# Patient Record
Sex: Male | Born: 1985 | Race: Black or African American | Hispanic: No | Marital: Single | Smoking: Never smoker
Health system: Southern US, Community
[De-identification: ages and names within clinical notes are randomized; demographics above are authoritative.]

## PROBLEM LIST (undated history)

## (undated) DIAGNOSIS — E669 Obesity, unspecified: Secondary | ICD-10-CM

## (undated) HISTORY — DX: Obesity, unspecified: E66.9

---

## 2006-05-24 ENCOUNTER — Emergency Department (HOSPITAL_COMMUNITY): Admission: EM | Admit: 2006-05-24 | Discharge: 2006-05-24 | Payer: Self-pay | Admitting: Emergency Medicine

## 2008-01-20 ENCOUNTER — Emergency Department (HOSPITAL_COMMUNITY): Admission: EM | Admit: 2008-01-20 | Discharge: 2008-01-21 | Payer: Self-pay | Admitting: Emergency Medicine

## 2008-01-28 ENCOUNTER — Emergency Department (HOSPITAL_COMMUNITY): Admission: EM | Admit: 2008-01-28 | Discharge: 2008-01-28 | Payer: Self-pay | Admitting: Emergency Medicine
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
## 2015-04-10 ENCOUNTER — Encounter (HOSPITAL_BASED_OUTPATIENT_CLINIC_OR_DEPARTMENT_OTHER): Payer: Self-pay | Admitting: *Deleted

## 2015-04-10 ENCOUNTER — Emergency Department (HOSPITAL_BASED_OUTPATIENT_CLINIC_OR_DEPARTMENT_OTHER)
Admission: EM | Admit: 2015-04-10 | Discharge: 2015-04-10 | Disposition: A | Discharge status: Home / Self Care | Payer: Managed Care, Other (non HMO) | Attending: Emergency Medicine | Admitting: Emergency Medicine

## 2015-04-10 DIAGNOSIS — J111 Influenza due to unidentified influenza virus with other respiratory manifestations: Secondary | ICD-10-CM | POA: Insufficient documentation

## 2015-04-10 DIAGNOSIS — R69 Illness, unspecified: Secondary | ICD-10-CM

## 2015-04-10 DIAGNOSIS — R05 Cough: Secondary | ICD-10-CM | POA: Diagnosis present

## 2015-04-10 NOTE — ED Notes (Signed)
Pt c/o flu like symptoms x 4 days Uri symptoms and n/v/d

## 2015-04-10 NOTE — Discharge Instructions (Signed)

## 2015-04-10 NOTE — ED Provider Notes (Signed)
CSN: [id number]     Arrival date & time 04/10/15  1753 History  This chart was scribed for Toy Cookey, MD by Chestine Spore, ED Scribe. The patient was seen in room MH05/MH05 at 7:22 PM.     Chief Complaint  Patient presents with  . URI      The history is provided by the patient. No language interpreter was used.    HPI Comments: Jimmy Long is a 29 y.o. male who presents to the Emergency Department complaining of URI onset 4 days. He states that he is having associated symptoms of n/d, HA, myalgias, fever, chills, dry cough, sneezing, mild SOB, sore throat. Pt had loose watery stools twice today without the presence of blood. He states that he has tried Alka seltzer cold with no relief for his symptoms. He denies vomiting, trouble urinating, blood in stools, and any other symptoms. Denies any sick contacts. Pt did not get the flu vaccine this year. Pt has not been on any abx recently. Pt has not been out of the country lately. Pt has not been eating and drinking normally because of lack of appetite. Pt denies heart and lung issues.   History reviewed. No pertinent past medical history. History reviewed. No pertinent past surgical history. History reviewed. No pertinent family history. History  Substance Use Topics  . Smoking status: Never Smoker   . Smokeless tobacco: Not on file  . Alcohol Use: No    Review of Systems  Constitutional: Positive for fever, chills and appetite change.  HENT: Positive for sneezing and sore throat. Negative for ear pain.   Respiratory: Positive for cough and shortness of breath (mild).   Gastrointestinal: Positive for nausea and diarrhea. Negative for vomiting and blood in stool.  Genitourinary: Negative for decreased urine volume.  Musculoskeletal: Positive for myalgias.  Neurological: Positive for headaches.   A complete 10 system review of systems was obtained and all systems are negative except as noted in the HPI and PMH.     Allergies   Review of patient's allergies indicates no known allergies.  Home Medications   Prior to Admission medications   Not on File   BP 129/79 mmHg  Pulse 84  Temp(Src) 99.8 F (37.7 C)  Resp 16  Ht  (1.727 m)  Wt 138 lb (62.596 kg)  BMI 20.99 kg/m2  SpO2 98%  Physical Exam  Constitutional: He is oriented to person, place, and time. He appears well-developed and well-nourished. No distress.  HENT:  Head: Normocephalic and atraumatic.  Right Ear: Tympanic membrane normal.  Left Ear: Tympanic membrane normal.  Mouth/Throat: Posterior oropharyngeal erythema present. No oropharyngeal exudate.  No exudate  Eyes: Pupils are equal, round, and reactive to light.  Neck: Normal range of motion. Neck supple.  Tonsillar enlargement.   Cardiovascular: Normal rate, regular rhythm and normal heart sounds.  Exam reveals no gallop and no friction rub.   No murmur heard. Pulmonary/Chest: Effort normal and breath sounds normal. No respiratory distress. He has no wheezes. He has no rales.  Abdominal: Soft. Bowel sounds are normal. He exhibits no distension and no mass. There is no tenderness. There is no rebound and no guarding.  Musculoskeletal: Normal range of motion. He exhibits no edema or tenderness.  Neurological: He is alert and oriented to person, place, and time.  Skin: Skin is warm and dry.  Psychiatric: He has a normal mood and affect.    ED Course  Procedures (including critical care time) DIAGNOSTIC STUDIES: Oxygen  Saturation is 98% on RA, nl by my interpretation.    COORDINATION OF CARE: 7:28 PM-Discussed treatment plan which includes f/u if the symptoms worsen, began taking cold and flu medications with pt at bedside and pt agreed to plan.   Labs Review Labs Reviewed - No data to display  Imaging Review No results found.   EKG Interpretation None      MDM   Final diagnoses:  Influenza-like illness    Patient presents with 4 days of influenza-like symptoms.   He is well-appearing on physical exam has benign.  Cardiopulmonary exam.  Suspect influenza and will recommend continued supportive care at home with antipyretics and by mouth hydration.  Return precautions given for new or worsening symptoms including development of shortness of breath, inability to tolerate liquids.   I personally performed the services described in this documentation, which was scribed in my presence. The recorded information has been reviewed and is accurate.     Toy CookeyMegan Luella Gardenhire, MD 04/10/15 [phone]

## 2022-08-24 ENCOUNTER — Telehealth (INDEPENDENT_AMBULATORY_CARE_PROVIDER_SITE_OTHER): Payer: Self-pay

## 2022-08-24 NOTE — Telephone Encounter (Signed)
Pt self scheduled the following appt online:  Future Appointments   Date Time Provider Department Center   09/24/2022 12:00 PM Julienne Kass, MD [facility] PCP PHR       Called pt. Left message to return call.    If pt returns call any agent can assist.    Please complete demographics/registration.  Please verify insurance.     Please assist.      Thank you.

## 2022-09-24 ENCOUNTER — Ambulatory Visit (INDEPENDENT_AMBULATORY_CARE_PROVIDER_SITE_OTHER): Payer: Federal, State, Local not specified - PPO | Admitting: Family Medicine

## 2022-09-24 ENCOUNTER — Encounter (INDEPENDENT_AMBULATORY_CARE_PROVIDER_SITE_OTHER): Payer: Self-pay | Admitting: Family Medicine

## 2022-09-24 VITALS — BP 123/67 | HR 57 | Temp 97.3°F | Resp 18 | Ht 75.0 in | Wt 275.0 lb

## 2022-09-24 DIAGNOSIS — R1031 Right lower quadrant pain: Secondary | ICD-10-CM

## 2022-09-24 DIAGNOSIS — Z8719 Personal history of other diseases of the digestive system: Secondary | ICD-10-CM

## 2022-09-24 DIAGNOSIS — Z1339 Encounter for screening examination for other mental health and behavioral disorders: Secondary | ICD-10-CM

## 2022-09-24 DIAGNOSIS — Z9889 Other specified postprocedural states: Secondary | ICD-10-CM

## 2022-09-24 NOTE — Progress Notes (Signed)
Family Medicine        Chief Complaint   Patient presents with    Establish Care    New Patient         Subjective:         Seth Vargas is a 36 year old male here for establish care.    GI -  Hernia surgery right inguinal region while in [place] 2018. For the past 6 months, noticing burning pulling sensation right lower abdominal / inguinal area. No relationship to position or meals.  Now works as a Administrator.     Shx  Smoked for 23 years. Quit 6 years ago. Interested in screening for conditions/[place] in his body.       OBJECTIVE  Vitals:    09/24/22 1142   BP: 123/67   BP Location: Left arm   BP Patient Position: Sitting   BP cuff size: Large   Pulse: 57   Resp: 18   Temp: 97.3 F (36.3 C)   TempSrc: Temporal   SpO2: 98%   Weight: 124.7 kg (275 lb)   Height: 6\' 3"  (1.905 m)       Physical exam:      Gen - WDWN, NAD  GI - S/ND,no masses ; RLQ discomfort on deep palpation.       Labs and Imaging:     Ordered         ASSESSMENT/PLAN        ICD-10-CM ICD-9-CM   1. Right lower quadrant pain  R10.31 789.03   2. History of hernia surgery  Z98.890 V45.89    Z87.19    3. Screened negative for significant alcohol use  Z13.39 V79.1     Given hx of surgery, discussed [place] abd. RLQ pain/burning x 6 months. No red flags on hx.   Pt opted to do Physical on a later visit.       Marlena Clipper, MD

## 2022-09-27 NOTE — Progress Notes (Unsigned)
Family Medicine     ***  ask if fasting  *** measure abdominal circumference       No chief complaint on file.        Subjective:         Seth Vargas is a 36 year old male here for establish care and physical ***    Obesity Class 1:  age at onset of weight gain,   events associated with weight gain,   previous weight loss attempts,   change in dietary patterns,   history of exercise,   current and past medications  history of smoking cessation.     Medications are a common cause of weight gain, in particular insulin, sulfonylureas, thiazolidinediones, glucocorticoids, and antipsychotics (table 3). Smoking cessation is also associated with weight gain. (See "Benefits and consequences of smoking cessation", section on 'Weight gain'.)    Exercise: ***  Diet: ***  Mood: ***  Work: ***  Alcohol: ***  Tobacco: ***  Drugs: ***    -Sexual history:   -How many sexual partners in past year: ***   -Type of sexual activity: []  oral, []  anal, []  vaginal   -History of STIs: ***   -Use of barrier contraception:***  -Most recent HIV screen: ***  -Most recent GC screen: ***   -Genital:***   -Oral:***   -Anal: ***  -Most recent Syphilis screen: ***  -Hx of PrEP therapy: ***  -Currently on PrEP: ***  -Hep B vaccination: ***  -Hep A vaccination: ***  -HCV AB: ***      Cardiovascular Disease:  Per 2021 USPSTF Blood Pressure Screening Guidelines and JNC-8  Blood pressure screen performed today:      [_] Pharmacologic treatment indicated given BP >150/90 in patient aged >37     [_] Pharmacologic treatment indicated given BP >140/90 in patient aged <60     [_] Pharmacologic treatment indicated given BP >140/90 in patient with diabetes     [***] Next screen due in 1 year as BP <140/90    Dyslipidemia:   Per 2013 USPSTF Lipid Disorders Screening Guidelines (Grade A)   I discussed the risks and benefits of lipid screening with the patient.   [***] Patient is 68 or older and has elected to undergo lipid screen   [_] Screening  recommended, patient declines screening   [_] No risk factors present, screening not indicated      Weight Control:   Per 2018 USPSTF Obesity Screening Guidelines (Grade B) and ACC/AHA   BMI screening performed today      []  Patient BMI < 18.5, see underweight plan below     []  Patient BMI WNL, discussed ACC/AHA diet and exercise recommendations to maintain weight     [x]  Patient BMI 25 or greater, see overweight plan below     []  Patient's BMI >25 and CVD risk factors (HTN, HLD, DM, personal or family history) or >30, see obesity plan below    Diabetes Mellitus:   Per 2021 USPSTF Type 2 Diabetes Mellitus Screening Guidelines (Grade B)  Indication: Asymptomatic adult age >83 with BMI >25  Based on the above risk factors:     [***] Screening indicated, patient elects to be screened for diabetes with a hemoglobin A1C      [_] Patient with evidence of pre-diabetes and will be screened yearly     [_] Patient without evidence of pre-diabetes and will be screened every 3 years     [_] Screening indicated, patient declines screening  Vaccines:   Per CDC Guidelines   ***Seasonal influenza: Recommended for all patients  ***Td/Tdap (1 dose every 10 years, can be administered regardless of interval)   ***Hepatitis A: Indication: Patient with risk factors (MSM,  IVDU, chronic liver disease or receive clotting factor  concentrates, working with HAV-infected primates or with HAV in a  research laboratory setting, travel to high or  intermediate endemicity of hepatitis A, close personal contact (eg,  household or regular babysitting) with an international adoptee during  the first 60 days after arrival in the [place] from a country with  high or intermediate endemicity)  ***Hepatitis B: Indication: Patient with risk factors (Sexually  active with >1 partner during the previous 6 months, seeking  evaluation or treatment for a STD, IVDU, MSM, Administrator workers, DM, end-stage renal disease, HIV,  and/or chronic  liver disease)     [***] Patient elects for the following vaccines: ***    [***] Patient declines the following vaccines: ***     STD:   Per USPSTF Screening Guidelines  ***Indication: Patient without risk factors, no further testing indicated  ***Indication: Patient with risk factors (MSM, IVDU, multiple partners, or seeking STD screening/treatment)  ***Indication: Patient is a sexually active male age <25.    [_] Patient elects to be screened for:          [_] GC/Chlamydia, HIV, and Hepatitis B, for risk factors listed above          [_] Syphilis, for risk factors (commercial sex worker and/or person in correctional facilities)     [_] Patient declines screening     HIV:   Per 2019 USPSTF HIV Infection Screening Guidelines (Grade A)  Indication: All low risk adults should receive 1 HIV screen during their lifetime. For patient with high risk sexual activity see above STD screening recommendations.    [***] Patient elects to be screened today    [***] Patient declines screening     Hepatitis C:   Per 2020 USPSTF Hepatitis C Screening Guidelines (Grade B)   Indication: All low risk adults should receive 1 Hepatitis C screen during their lifetime. For patient with high risk sexual activity see above STD screening recommendations.    [***] Patient elects to be screened today    [***] Patient declines screening     Psycho-Social:      [***] Patient without evidence of or risk factors for depression, anxiety, alcohol, tobacco, or domestic abuse      [***] Patient screened positive, see below for plan        Review of systems:  ***  Review of Systems - Constitutional: negative for: fatigue, weight loss, fever.  Eyes: negative for:  blurry vision, eye pain.  Ears, Nose, Mouth, Throat: negative for:  sore throat, nasal congestion, rhinorrhea, ear pain.  CV: negative for:  palpitations, chest pain, lower extremity edema.  Resp: negative for:  cough, shortness of breath, wheezing.  GI: negative for:  vomiting, nausea, abdominal pain, diarrhea.  GU: negative for: dysuria, hematuria.  Musculoskeletal: negative for: joint swelling, joint pain, muscle weakness.  Integumentary: negative for: rash, itching.  Neuro: negative for: headaches, seizures, numbness or tingling.  Psych: negative for: depressed mood, anxiety, and suicidal ideation.      OBJECTIVE  There were no vitals filed for this visit.    Physical exam:    ***  Gen - WDWN, NAD  Eyes - EOMI, PERRLA, Conjunctiva clear  ENT - External ears  appear normal, TMs normal bilaterally, MMM, oropharynx normal  Neck - Supple, no thyromegaly  Resp - CTA bilaterally, normal resp effort  Card - RRR, normal S1 and S2, no m/r/g  GI - S/NT/ND,no masses  GU - Male - chaperone offered and pt declined. descended testes bilaterally, no inguinal hernia, circumcised phallus, no penile discharge ***  MSK -   Shoulder - Normal ROM of shoulder bilaterally on abduction, forward flexion., no effusion. Knee ROM - flexion, extension WNL, neg Varus and Valgus bilaterally, no effusion.   Hip - external and internal rotation WNL, Hip Flexion WNL bilaterally.   Wrist and elbows - Normal ROM on flexion and extension, no effusion.  Neuro - CN 2-12 intact,  No focal deficit present. Mental Status: He is alert.   Psychiatric -   Mood and Affect: normal. Thought content normal. Judgment normal.      Smoking Cessation Counseling - ***   Discussed with pt that it is very important that pt quits smoking. There are various alternatives available to help with this difficult task, but first and foremost, pt must make a firm commitment and decide to quit. The nature of nicotine addiction is discussed. The usefulness of behavioral therapy is discussed and suggested.  The correct use, cost and side effects of nicotine replacement therapy, such as gum or patches, are discussed. Zyban and its cost and side effects are reviewed. The quit rates are discussed. I recommend pt  not allow costs of treatment to  deter him from using NRT, chantix or Wellbutrin, as the long-term economic and health benefits are obvious.    Time spent 3-10 minutes/>10 minutes***.      Labs and Imaging:     Lipids:  No results found for: CHOL, HDL, LDLCALC, TRIG, LDLDIRECT    Diabetes screen:No results found for: A1C  Thyroid:  No results found for: TSH        ASSESSMENT/PLAN      Preventative labs discussed.   Advised healthy lifestyle, diet and exercise.   Encouraged annual physicals.   ***Immunization today - reviewed vaccine components, counseled regarding risks/benefits, signs/symptoms of adverse effects and when to seek medical attention for any adverse effects.    ***    Forrest Moron, PA-C  [facility]

## 2022-09-28 ENCOUNTER — Encounter (INDEPENDENT_AMBULATORY_CARE_PROVIDER_SITE_OTHER): Payer: Self-pay | Admitting: Physician Assistant

## 2022-09-28 ENCOUNTER — Other Ambulatory Visit: Payer: Federal, State, Local not specified - PPO | Attending: Physician Assistant | Admitting: Physician Assistant

## 2022-09-28 VITALS — BP 129/65 | HR 63 | Temp 97.3°F | Ht 75.0 in | Wt 266.8 lb

## 2022-09-28 DIAGNOSIS — Z1159 Encounter for screening for other viral diseases: Secondary | ICD-10-CM | POA: Insufficient documentation

## 2022-09-28 DIAGNOSIS — Z13220 Encounter for screening for lipoid disorders: Secondary | ICD-10-CM | POA: Insufficient documentation

## 2022-09-28 DIAGNOSIS — Z114 Encounter for screening for human immunodeficiency virus [HIV]: Secondary | ICD-10-CM | POA: Insufficient documentation

## 2022-09-28 DIAGNOSIS — Z0000 Encounter for general adult medical examination without abnormal findings: Secondary | ICD-10-CM | POA: Insufficient documentation

## 2022-09-28 DIAGNOSIS — Z6834 Body mass index (BMI) 34.0-34.9, adult: Secondary | ICD-10-CM | POA: Insufficient documentation

## 2022-09-28 DIAGNOSIS — Z23 Encounter for immunization: Secondary | ICD-10-CM

## 2022-09-28 DIAGNOSIS — L2082 Flexural eczema: Secondary | ICD-10-CM

## 2022-09-28 DIAGNOSIS — Z87891 Personal history of nicotine dependence: Secondary | ICD-10-CM

## 2022-09-28 DIAGNOSIS — E669 Obesity, unspecified: Secondary | ICD-10-CM | POA: Insufficient documentation

## 2022-09-28 LAB — COMPREHENSIVE METABOLIC PANEL, BLOOD
ALT (SGPT): 30 U/L (ref 0–41)
AST (SGOT): 23 U/L (ref 0–40)
Albumin: 4.4 g/dL (ref 3.5–5.2)
Alkaline Phos: 57 U/L (ref 40–129)
Anion Gap: 9 mmol/L (ref 7–15)
BUN: 8 mg/dL (ref 6–20)
Bicarbonate: 27 mmol/L (ref 22–29)
Bilirubin, Tot: 0.52 mg/dL (ref <1.2)
Calcium: 9.4 mg/dL (ref 8.5–10.6)
Chloride: 103 mmol/L (ref 98–107)
Creatinine: 1.02 mg/dL (ref 0.67–1.17)
Glucose: 85 mg/dL (ref 70–99)
Potassium: 4.3 mmol/L (ref 3.5–5.1)
Sodium: 139 mmol/L (ref 136–145)
Total Protein: 7.5 g/dL (ref 6.0–8.0)
eGFR Based on CKD-EPI 2021 Equation: >60 mL/min/{1.73_m2}

## 2022-09-28 LAB — TSH, BLOOD: TSH: 1.25 u[IU]/mL (ref 0.27–4.20)

## 2022-09-28 LAB — HIV 1/2 ANTIBODY & P24 ANTIGEN ASSAY, BLOOD: HIV 1/2 Antibody & P24 Antigen Assay: NONREACTIVE

## 2022-09-28 LAB — GLYCOSYLATED HGB(A1C), BLOOD: Glyco Hgb (A1C): 5.1 % (ref 4.8–5.8)

## 2022-09-28 LAB — LIPID(CHOL FRACT) PANEL, BLOOD
Cholesterol: 178 mg/dL (ref <200)
HDL-Cholesterol: 50 mg/dL
LDL-Chol (Calc): 119 mg/dL (ref <160)
Non-HDL Cholesterol: 128 mg/dL
Triglycerides: 43 mg/dL (ref 10–170)

## 2022-09-28 LAB — CBC WITH DIFF, BLOOD
ANC-Automated: 2.6 10*3/uL (ref 1.6–7.0)
Abs Basophils: 0 10*3/uL (ref <0.2)
Abs Eosinophils: 0.2 10*3/uL (ref 0.0–0.5)
Abs Lymphs: 1.4 10*3/uL (ref 0.8–3.1)
Abs Monos: 0.4 10*3/uL (ref 0.2–0.8)
Basophils: 1 %
Eosinophils: 5 %
Hct: 47.7 % (ref 40.0–50.0)
Hgb: 15.4 gm/dL (ref 13.7–17.5)
Lymphocytes: 31 %
MCH: 26.8 pg (ref 26.0–32.0)
MCHC: 32.3 g/dL (ref 32.0–36.0)
MCV: 83.1 um3 (ref 79.0–95.0)
MPV: 11.2 fL (ref 9.4–12.4)
Monocytes: 8 %
Plt Count: 161 10*3/uL (ref 140–370)
RBC: 5.74 10*6/uL (ref 4.60–6.10)
RDW: 14.4 % — ABNORMAL HIGH (ref 12.0–14.0)
Segs: 56 %
WBC: 4.6 10*3/uL (ref 4.0–10.0)

## 2022-09-28 LAB — HEPATITIS C AB, BLOOD: Hepatitis C Ab: NONREACTIVE

## 2022-09-28 MED ORDER — TRIAMCINOLONE ACETONIDE 0.1 % EX OINT
1.0000 | TOPICAL_OINTMENT | Freq: Two times a day (BID) | CUTANEOUS | 0 refills | Status: AC
Start: 2022-09-28 — End: 2022-10-12

## 2022-09-28 NOTE — Interdisciplinary (Signed)
2 Patient Identifiers verified Seth Vargas and 1986/12/17, Allergies, Dose, Vaccine/Medication & MD order verified prior to admin. Pt aware to wait 15 min after injection. Pt was closely monitored for any complications before leaving office. Appropriate discharge documentation provided.  Pt tolerated well.

## 2022-09-28 NOTE — Interdisciplinary (Signed)
Blood drawn from left arm with 21 gauge needle. 5 tubes taken.   Patient identity authenticated by Cullen Vanallen.

## 2022-10-02 ENCOUNTER — Encounter (INDEPENDENT_AMBULATORY_CARE_PROVIDER_SITE_OTHER): Payer: Self-pay | Admitting: Hospital

## 2022-10-14 ENCOUNTER — Encounter (INDEPENDENT_AMBULATORY_CARE_PROVIDER_SITE_OTHER): Payer: Federal, State, Local not specified - PPO | Admitting: Family Medicine

## 2022-10-14 DIAGNOSIS — Z131 Encounter for screening for diabetes mellitus: Secondary | ICD-10-CM

## 2022-10-14 DIAGNOSIS — Z13220 Encounter for screening for lipoid disorders: Secondary | ICD-10-CM

## 2022-10-14 DIAGNOSIS — Z0000 Encounter for general adult medical examination without abnormal findings: Secondary | ICD-10-CM

## 2022-10-19 ENCOUNTER — Telehealth (HOSPITAL_BASED_OUTPATIENT_CLINIC_OR_DEPARTMENT_OTHER): Payer: Self-pay

## 2022-10-19 NOTE — Telephone Encounter (Signed)
Called and left voicemail for patient to call [phone] to schedule Nutrition consult.

## 2022-10-21 NOTE — Telephone Encounter (Signed)
Called and left voicemail for patient to call [phone] to schedule Nutrition consult.

## 2022-11-03 ENCOUNTER — Other Ambulatory Visit (INDEPENDENT_AMBULATORY_CARE_PROVIDER_SITE_OTHER): Payer: Federal, State, Local not specified - PPO

## 2022-12-31 ENCOUNTER — Other Ambulatory Visit (INDEPENDENT_AMBULATORY_CARE_PROVIDER_SITE_OTHER): Payer: Federal, State, Local not specified - PPO

## 2023-01-13 ENCOUNTER — Encounter (INDEPENDENT_AMBULATORY_CARE_PROVIDER_SITE_OTHER): Payer: Self-pay | Admitting: Physician Assistant

## 2023-01-13 NOTE — Telephone Encounter (Signed)
From: Gretel Acre  To: Forrest Moron, PA  Sent: 01/13/2023 10:39 AM PST  Subject: Schedule    Hello,   I would like to schedule office visit.    Are your taking new patients this year?  Not seeing where I can book a time     Thanks   Seth Vargas  [phone]

## 2023-01-13 NOTE — Telephone Encounter (Signed)
Sent Patient MyChart Message

## 2023-01-13 NOTE — Telephone Encounter (Signed)
Appointment scheduled 01/19/23 with Seth Vargas

## 2023-01-19 ENCOUNTER — Encounter (INDEPENDENT_AMBULATORY_CARE_PROVIDER_SITE_OTHER): Payer: Federal, State, Local not specified - PPO | Admitting: Physician Assistant

## 2023-01-20 ENCOUNTER — Encounter (INDEPENDENT_AMBULATORY_CARE_PROVIDER_SITE_OTHER): Payer: Self-pay | Admitting: Hospital

## 2023-01-27 ENCOUNTER — Encounter (INDEPENDENT_AMBULATORY_CARE_PROVIDER_SITE_OTHER): Payer: Self-pay | Admitting: Hospital

## 2023-01-27 ENCOUNTER — Telehealth (INDEPENDENT_AMBULATORY_CARE_PROVIDER_SITE_OTHER): Payer: Federal, State, Local not specified - PPO | Admitting: Pulmonary Disease

## 2023-01-27 ENCOUNTER — Encounter (INDEPENDENT_AMBULATORY_CARE_PROVIDER_SITE_OTHER): Payer: Self-pay | Admitting: Pulmonary Disease

## 2023-01-27 VITALS — Ht 75.0 in | Wt 266.8 lb

## 2023-01-27 DIAGNOSIS — E669 Obesity, unspecified: Secondary | ICD-10-CM

## 2023-01-27 DIAGNOSIS — Z6834 Body mass index (BMI) 34.0-34.9, adult: Secondary | ICD-10-CM

## 2023-01-27 DIAGNOSIS — G4733 Obstructive sleep apnea (adult) (pediatric): Secondary | ICD-10-CM

## 2023-01-27 DIAGNOSIS — R5383 Other fatigue: Secondary | ICD-10-CM

## 2023-01-27 NOTE — Patient Instructions (Signed)
Assessment:  Patient has snoring arousals and daytime fatigue  DiScussed the nature of sleep apnea and reviewed the options for testing and treatment  Advised a home sleep study should be done which was explained  Will see patient after testing and will followup after testing  Patient understood and agreed.    Plan:  The patient should be cautioned against driving while feeling sleepy.  Conservative measures include the goal of seven to eight hours of sleep per night, the maintenance of nasal patency, the avoidance of depressants including alcohol and weight loss as indicated (through diet and exercise).  Adherence to therapy was discussed at some length.  General measures of sleep hygiene include the avoidance of naps, alcohol, tobacco and caffeine.  Maintaining a fixed bed time and wake up time while avoiding prolonged periods in bed while awake can be helpful.  We recommend moving the alarm clock to a position that is not visible by the patient.  We also recommend avoiding exercise close to bed time but timing exercise instead earlier in the day.  We have recommended follow-up in clinic and the patient will call sooner if necessary.

## 2023-01-27 NOTE — Progress Notes (Signed)
[place]  [address],  [place] [address]      History of Present Illness:  Seth Vargas is a 37 year old male is here for a sleep evaluation.  Patient has been married for 2 years and wife has noted snoring.  Patient aware of this for many years.  Normally goes to bed between 9-10 pm and does not take sleeping pills or alcohol.  Falls asleep in 15-20 min and awakens 2-3 x a night and usually has nocturia and gets back to sleep. Has had sweats at night and rare palpitations. Gets up between 6-7 am without an alarm clock and partially rested and takes a nap during the day. 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     Denies nocturnal gasping choking or breathless, sleep walking,leg jerks, restless legs,  talking or dream enactment.        The Epworth Sleepiness Score is Epworth Total Score: 8 with a maximum of 24.      ---------------------(data below generated by Lorelee Cover, DO)--------------------    Patient Verification & Telemedicine Consent:    I am proceeding with this evaluation at the direct request of the patient.  I have verified this is the correct patient and have obtained verbal consent and written consent from the patient/ surrogate to perform this voluntary telemedicine evaluation (including obtaining history, performing examination and reviewing data provided by the patient).   The patient/ surrogate has the right to refuse this evaluation.  I have explained risks (including potential loss of confidentiality), benefits, alternatives, and the potential need for subsequent face to face care. Patient/ surrogate understands that there is a risk of medical inaccuracies given that our recommendations will be made based on reported data (and we must therefore assume this information is accurate).  Knowing that there is a risk that this information is not reported accurately, and that the telemedicine video, audio, or data feed may be  incomplete, the patient agrees to proceed with evaluation and holds [place] harmless knowing these risks. In this evaluation, we will be providing recommendations only. The patient/ surrogate has been notified that other healthcare professionals (including students, residents and Engineer, maintenance) may be involved in this audio-video evaluation.   All laws concerning confidentiality and patient access to medical records and copies of medical records apply to telemedicine.  The patient/ surrogate has received the [place] Notice of Privacy Practices.  I have reviewed this above verification and consent paragraph with the patient/ surrogate.  If the patient is not capacitated to understand the above, and no surrogate is available, since this is not an emergency evaluation, the visit will be rescheduled until such time that the patient can consent, or the surrogate is available to consent.    Demographics:   Medical Record #: [id number]   Date: January 27, 2023   Patient Name: Seth Vargas   DOB: September 03, 1986  Age: 37 year old  Sex: male  Location: Home address on file    Evaluator(s):   Kay Ricciuti was evaluated by me today.    Clinic Location: [facility] [address] [facility]  [address]  [place] [address]          Review of Systems:    (Positives in bold)    Constitutional: fever, chills, hot flashes/night sweats in the last year, fatigue, weight change  HEENT:Has corrective lense. No vision changes, dizziness, HA, dysphagia, hearing loss, rhinorrhea, sore throat  CV: NO CP, palpitations, orthopnea, DOE, PND, peripheral edema  Respiratory: No SOB, cough, wheezing, hemoptysis  GI: No Anorexia, nausea, vomiting, diarrhea, constipation, abdominal pain, bloody stool  GU: No dysuria, hesitancy, urgency, hematuria  MSK: NO muscle weakness, joint pain, decreased range of motion  Neuro:Low back pain . NO   dizziness, weakness, numbness, tingling, syncope,  bladder/bowel incontinence   Integumentary: No rash  Psych: No depression, anxiety    Past Medical History:   Diagnosis Date    Obesity        There is no problem list on file for this patient.      Past Surgical History:   Procedure Laterality Date    HERNIA REPAIR Right 2018    inguinal hernia       Family History   Problem Relation Name Age of Onset    Cancer Mother          cervical [place]    No Known Problems Father      No Known Problems Sister      No Known Problems Brother      Cancer M Grandmother          throat [place] - smoker    No Known Problems P Grandmother      No Known Problems P Grandfather         Social History     Socioeconomic History    Marital status: Single   Tobacco Use    Smoking status: Former     Packs/day: 0.50     Years: 23.00     Additional pack years: 0.00     Total pack years: 11.50     Types: Cigarettes     Quit date: 2017     Years since quitting: 7.0    Smokeless tobacco: Never   Vaping Use    Vaping Use: Never used   Substance and Sexual Activity    Alcohol use: Yes     Alcohol/week: 1.0 standard drink of alcohol     Types: 1 Cans of beer per week    Drug use: Never    Sexual activity: Yes     Partners: Female   Other Topics Concern    Military Service No    Blood Transfusions No    Caffeine Concern No    Occupational Exposure No    Hobby Hazards No    Sleep Concern No    Stress Concern Yes     Comment: stress in owning trucking business    Weight Concern Yes    Special Diet No    Back Care Yes    Exercises Regularly Yes     Comment: twice weekly. treadmill and weights for 20 min each    Seat Belt Use Yes    Performs Self-Exams Yes         No Known Allergies    No current outpatient medications on file prior to visit.     No current facility-administered medications on file prior to visit.           I have reviewed the following  laboratory data and other diagnostic studies:   CBC:  Lab Results   Component Value Date    WBC 4.6 09/28/2022    HGB 15.4 09/28/2022    HCT 47.7 09/28/2022     PLT 161 09/28/2022     CHEM:  Lab Results  Component Value Date    NA 139 09/28/2022    K 4.3 09/28/2022    CL 103 09/28/2022    BICARB 27 09/28/2022    BUN 8 09/28/2022    CREAT 1.02 09/28/2022    GLU 85 09/28/2022    [place] 9.4 09/28/2022     COAG:  No results found for: "PT", "PTT", "INR"  LFTs:  Lab Results   Component Value Date    AST 23 09/28/2022    ALT 30 09/28/2022    ALK 57 09/28/2022    TP 7.5 09/28/2022    ALB 4.4 09/28/2022    TBILI 0.52 09/28/2022            I have nothing else to add to the documentation regarding the patient's allergies, medications, family history, social history, review of systems or physical examination.     Assessment:  Patient has snoring arousals and daytime fatigue  DiScussed the nature of sleep apnea and reviewed the options for testing and treatment  Advised a home sleep study should be done which was explained  Will see patient after testing and will followup after testing  Patient understood and agreed.    Plan:  The patient should be cautioned against driving while feeling sleepy.  Conservative measures include the goal of seven to eight hours of sleep per night, the maintenance of nasal patency, the avoidance of depressants including alcohol and weight loss as indicated (through diet and exercise).  Adherence to therapy was discussed at some length.  General measures of sleep hygiene include the avoidance of naps, alcohol, tobacco and caffeine.  Maintaining a fixed bed time and wake up time while avoiding prolonged periods in bed while awake can be helpful.  We recommend moving the alarm clock to a position that is not visible by the patient.  We also recommend avoiding exercise close to bed time but timing exercise instead earlier in the day.  We have recommended follow-up in clinic and the patient will call sooner if necessary.

## 2023-01-29 ENCOUNTER — Encounter (INDEPENDENT_AMBULATORY_CARE_PROVIDER_SITE_OTHER): Payer: Self-pay | Admitting: Pulmonary Disease

## 2023-01-29 DIAGNOSIS — G4733 Obstructive sleep apnea (adult) (pediatric): Secondary | ICD-10-CM

## 2023-02-03 ENCOUNTER — Encounter (INDEPENDENT_AMBULATORY_CARE_PROVIDER_SITE_OTHER): Payer: Federal, State, Local not specified - PPO

## 2023-02-16 ENCOUNTER — Encounter (INDEPENDENT_AMBULATORY_CARE_PROVIDER_SITE_OTHER): Payer: Federal, State, Local not specified - PPO

## 2023-02-17 ENCOUNTER — Ambulatory Visit (INDEPENDENT_AMBULATORY_CARE_PROVIDER_SITE_OTHER): Payer: Federal, State, Local not specified - PPO | Admitting: Sleep Studies

## 2023-02-17 DIAGNOSIS — G4733 Obstructive sleep apnea (adult) (pediatric): Secondary | ICD-10-CM

## 2023-02-17 DIAGNOSIS — R5383 Other fatigue: Secondary | ICD-10-CM

## 2023-02-17 DIAGNOSIS — E669 Obesity, unspecified: Secondary | ICD-10-CM

## 2023-02-17 NOTE — Procedures (Signed)
[facility]  [address]  Phone [phone]      MEDICAL EQUIPMENT DISCLOSURE      Seth Vargas Date of Birth: [birth year]  MRN [id number]      The equipment patient is taking is a [name] Home Sleep Study 02/17/23:  He/She was referred for a home sleep test (HST) for symptoms and concerns associated with OSA. Education of the [employer] equipment was completed by Marcelene Butte, RPSGT.    Visit Summary: Instruction was given on the use and proper placement of each component of the HST.  After reviewing the procedure with the sleep tech, the patient verbally demonstrated how to properly place the HST device and all necessary components (respiratory belt, ECG, airflow/nasal pressure, and SpO2).  The patient was given an on call number of [phone] to call with any questions they may have.     This equipment is for diagnostic purposes only.    Further, your signature below indicates that patient has been instructed on operating the device.    Instructor's Name: Marcelene Butte, RPSGT      Date: 02/17/23    Electronically signed by, Marcelene Butte , RPSGT

## 2023-03-02 ENCOUNTER — Encounter (INDEPENDENT_AMBULATORY_CARE_PROVIDER_SITE_OTHER): Payer: Federal, State, Local not specified - PPO

## 2023-03-07 DIAGNOSIS — G4730 Sleep apnea, unspecified: Secondary | ICD-10-CM

## 2023-03-21 ENCOUNTER — Encounter (INDEPENDENT_AMBULATORY_CARE_PROVIDER_SITE_OTHER): Payer: Self-pay | Admitting: Hospital

## 2023-03-24 ENCOUNTER — Telehealth (INDEPENDENT_AMBULATORY_CARE_PROVIDER_SITE_OTHER): Payer: Commercial Managed Care - PPO | Admitting: Pulmonary Disease

## 2023-03-24 ENCOUNTER — Encounter (INDEPENDENT_AMBULATORY_CARE_PROVIDER_SITE_OTHER): Payer: Self-pay | Admitting: Pulmonary Disease

## 2023-03-24 ENCOUNTER — Encounter (INDEPENDENT_AMBULATORY_CARE_PROVIDER_SITE_OTHER): Payer: Self-pay | Admitting: Hospital

## 2023-03-24 ENCOUNTER — Telehealth (INDEPENDENT_AMBULATORY_CARE_PROVIDER_SITE_OTHER): Payer: Self-pay | Admitting: Pulmonary Disease

## 2023-03-24 ENCOUNTER — Encounter (INDEPENDENT_AMBULATORY_CARE_PROVIDER_SITE_OTHER): Payer: Commercial Managed Care - PPO | Admitting: Pulmonary Disease

## 2023-03-24 VITALS — Ht 75.0 in | Wt 266.8 lb

## 2023-03-24 DIAGNOSIS — G4722 Circadian rhythm sleep disorder, advanced sleep phase type: Secondary | ICD-10-CM

## 2023-03-24 DIAGNOSIS — G4733 Obstructive sleep apnea (adult) (pediatric): Secondary | ICD-10-CM

## 2023-03-24 DIAGNOSIS — R5383 Other fatigue: Secondary | ICD-10-CM

## 2023-03-24 NOTE — Telephone Encounter (Signed)
Called patient left VM to login to video appt

## 2023-03-24 NOTE — Progress Notes (Signed)
This patient was referred by Cecille Amsterdam, DO  [address]  [address]  [place],  [place] [address]      History of Present Illness:  Seth Vargas is a 37 year old male is here for followup after a home sleep study which was done on 02/17/2023.  The REI was 3.7.  Discussed with patient who notes snoring arousals fatigue. His wife sleeps separately.  Will arrange for an in lab sleep study to see if sleep apnea since patient has symptoms but a normal home study.     He also has a tendency for advanced sleep phase with his bedtime at 7-8 pm and tends to wake at 5-6 am.   Discussed getting a light box and sitting in front of it for 30-60 min between 630-730  am with no sunglasses. Doing this every day with a 10,000 lux light box used arm length away you can gradually shift your sleep onset to [phone] pm and you will then wake about an hour later after a month or two.            The Epworth Sleepiness Score is   with a maximum of 24.      ---------------------(data below generated by Cecille Amsterdam, DO)--------------------    Patient Verification & Telemedicine Consent:    I am proceeding with this evaluation at the direct request of the patient.  I have verified this is the correct patient and have obtained verbal consent and written consent from the patient/ surrogate to perform this voluntary telemedicine evaluation (including obtaining history, performing examination and reviewing data provided by the patient).   The patient/ surrogate has the right to refuse this evaluation.  I have explained risks (including potential loss of confidentiality), benefits, alternatives, and the potential need for subsequent face to face care. Patient/ surrogate understands that there is a risk of medical inaccuracies given that our recommendations will be made based on reported data (and we must therefore assume this information is accurate).  Knowing that there is a risk that this information is not  reported accurately, and that the telemedicine video, audio, or data feed may be incomplete, the patient agrees to proceed with evaluation and holds [place] harmless knowing these risks. In this evaluation, we will be providing recommendations only. The patient/ surrogate has been notified that other healthcare professionals (including students, residents and Metallurgist) may be involved in this audio-video evaluation.   All laws concerning confidentiality and patient access to medical records and copies of medical records apply to telemedicine.  The patient/ surrogate has received the [place] Notice of Privacy Practices.  I have reviewed this above verification and consent paragraph with the patient/ surrogate.  If the patient is not capacitated to understand the above, and no surrogate is available, since this is not an emergency evaluation, the visit will be rescheduled until such time that the patient can consent, or the surrogate is available to consent.    Demographics:   Medical Record #: [id number]   Date: March 24, 2023   Patient Name: Seth Vargas   DOB: [birth year]  Age: 37 year old  Sex: male  Location: Home address on file    Evaluator(s):   Naoki Huckeba was evaluated by me today.    Clinic Location: [facility] [address] [facility]  [address]  [place] [id number]      Review of Systems:    (  Positives in bold)    Constitutional: fever, chills, hot flashes/night sweats in the last year, fatigue, weight change  HEENT:Has corrective lense. No vision changes, dizziness, HA, dysphagia, hearing loss, rhinorrhea, sore throat  CV: NO CP, palpitations, orthopnea, DOE, PND, peripheral edema  Respiratory: No SOB, cough, wheezing, hemoptysis  GI: No Anorexia, nausea, vomiting, diarrhea, constipation, abdominal pain, bloody stool  GU: No dysuria, hesitancy, urgency, hematuria  MSK: NO muscle weakness, joint pain, decreased range of  motion  Neuro:Low back pain . NO   dizziness, weakness, numbness, tingling, syncope, bladder/bowel incontinence   Integumentary: No rash  Psych: No depression, anxiety    Past Medical History:   Diagnosis Date    Obesity        There is no problem list on file for this patient.      Past Surgical History:   Procedure Laterality Date    HERNIA REPAIR Right 2018    inguinal hernia       Family History   Problem Relation Name Age of Onset    Cancer Mother          cervical [place]    No Known Problems Father      No Known Problems Sister      No Known Problems Brother      Cancer M Grandmother          throat  - smoker    No Known Problems P Grandmother      No Known Problems P Grandfather         Social History     Socioeconomic History    Marital status: Single   Tobacco Use    Smoking status: Former     Packs/day: 0.50     Years: 23.00     Additional pack years: 0.00     Total pack years: 11.50     Types: Cigarettes     Quit date: 2017     Years since quitting: 7.2    Smokeless tobacco: Never   Vaping Use    Vaping Use: Never used   Substance and Sexual Activity    Alcohol use: Yes     Alcohol/week: 1.0 standard drink of alcohol     Types: 1 Cans of beer per week    Drug use: Never    Sexual activity: Yes     Partners: Female   Other Topics Concern    Military Service No    Blood Transfusions No    Caffeine Concern No    Occupational Exposure No    Hobby Hazards No    Sleep Concern No    Stress Concern Yes     Comment: stress in owning trucking business    Weight Concern Yes    Special Diet No    Back Care Yes    Exercises Regularly Yes     Comment: twice weekly. treadmill and weights for 20 min each    Seat Belt Use Yes    Performs Self-Exams Yes         No Known Allergies    No current outpatient medications on file prior to visit.     No current facility-administered medications on file prior to visit.           I have reviewed the following  laboratory data and other diagnostic studies:   CBC:  Lab Results    Component Value Date    WBC 4.6 09/28/2022    HGB 15.4 09/28/2022  HCT 47.7 09/28/2022    PLT 161 09/28/2022     CHEM:  Lab Results   Component Value Date    NA 139 09/28/2022    K 4.3 09/28/2022    CL 103 09/28/2022    BICARB 27 09/28/2022    BUN 8 09/28/2022    CREAT 1.02 09/28/2022    GLU 85 09/28/2022    [place] 9.4 09/28/2022     COAG:  No results found for: "PT", "PTT", "INR"  LFTs:  Lab Results   Component Value Date    AST 23 09/28/2022    ALT 30 09/28/2022    ALK 57 09/28/2022    TP 7.5 09/28/2022    ALB 4.4 09/28/2022    TBILI 0.52 09/28/2022            I have nothing else to add to the documentation regarding the patient's allergies, medications, family history, social history, review of systems or physical examination.     Assessment:  Has snoring arousals fatigue and normal home sleep study  Will arrange for an in lab sleep study and see patient after testing    Also patient has a tendency for advanced sleep phase syndrome with bedtime 730-830 pm.  Advised getting a 10,000 lux light box and using it with no sunglasses and keeping it arms length arms length away about 30-60 min every night between 630-730 pm every night.  AFter this have bed time at [phone] pm if possible. It will be easier to go to bed at [phone] pm after using the light box as above for at least 3-6 weeks and continue using this.     Will see you after the in lab sleep study and discuss options further at that time after the in lab study is completed.  Plan:  The patient should be cautioned against driving while feeling sleepy.  Conservative measures include the goal of seven to eight hours of sleep per night, the maintenance of nasal patency, the avoidance of depressants including alcohol and weight loss as indicated (through diet and exercise).  Adherence to therapy was discussed at some length.  General measures of sleep hygiene include the avoidance of naps, alcohol, tobacco and caffeine.  Maintaining a fixed bed time and wake up  time while avoiding prolonged periods in bed while awake can be helpful.  We recommend moving the alarm clock to a position that is not visible by the patient.  We also recommend avoiding exercise close to bed time but timing exercise instead earlier in the day.  We have recommended follow-up in clinic and the patient will call sooner if necessary.

## 2023-03-25 ENCOUNTER — Institutional Professional Consult (permissible substitution) (INDEPENDENT_AMBULATORY_CARE_PROVIDER_SITE_OTHER): Payer: Federal, State, Local not specified - PPO | Admitting: Critical Care Medicine

## 2023-03-25 ENCOUNTER — Encounter (INDEPENDENT_AMBULATORY_CARE_PROVIDER_SITE_OTHER): Payer: Self-pay | Admitting: Hospital

## 2023-04-10 ENCOUNTER — Encounter (INDEPENDENT_AMBULATORY_CARE_PROVIDER_SITE_OTHER): Payer: Self-pay | Admitting: Internal Medicine

## 2023-04-10 DIAGNOSIS — Z0000 Encounter for general adult medical examination without abnormal findings: Secondary | ICD-10-CM

## 2023-04-23 ENCOUNTER — Encounter (INDEPENDENT_AMBULATORY_CARE_PROVIDER_SITE_OTHER): Payer: Self-pay | Admitting: Pulmonary Disease

## 2023-04-23 DIAGNOSIS — G4733 Obstructive sleep apnea (adult) (pediatric): Secondary | ICD-10-CM

## 2023-08-06 NOTE — Progress Notes (Signed)
Sleep Lab Screening Tool    Point     0 Patients who do not need assistance 0   1 Patient who may need a translator, Trach, Oxygen more than 5 liters 0   2 Patient in a wheelchair with a caregiver 0   3 Quadriplegic/Paraplegic 0   4 Complex patients: Down Syndrome, Dementia, Non-Verbal, Neuromuscular. Easy agitated 0    Total Score 0     Patients scoring 4 or more points will be assigned a 1:1 ratio, patient to technician.        Comments/Notes:

## 2023-08-09 ENCOUNTER — Telehealth (INDEPENDENT_AMBULATORY_CARE_PROVIDER_SITE_OTHER): Payer: Self-pay | Admitting: Pulmonary Disease

## 2023-08-09 NOTE — Telephone Encounter (Signed)
Caller: patient  Relationship to patient: self  Phone # [phone]   Provider: Dr. Clide Cliff  Notes:     Pt called stating that he spoke to his insurance [employer] today and they told him for his upcoming PSG scheduled on 08/16/23 requires PA and they did not see any order submitted for it. Please advise. Thank you

## 2023-08-09 NOTE — Telephone Encounter (Signed)
Spoke with patient I let him know that no Berkley Harvey was needed. Confirmed with referral coordinator.

## 2023-08-16 ENCOUNTER — Ambulatory Visit (INDEPENDENT_AMBULATORY_CARE_PROVIDER_SITE_OTHER): Payer: Commercial Managed Care - PPO | Admitting: Pulmonary Disease

## 2023-08-16 DIAGNOSIS — R5383 Other fatigue: Secondary | ICD-10-CM

## 2023-08-16 DIAGNOSIS — G4722 Circadian rhythm sleep disorder, advanced sleep phase type: Secondary | ICD-10-CM

## 2023-08-16 DIAGNOSIS — G4733 Obstructive sleep apnea (adult) (pediatric): Secondary | ICD-10-CM

## 2023-08-17 NOTE — Procedures (Signed)
Seth Vargas  [birth year]  BMI: 32.4  Neck Size (Inches): 17'  Tech running study: Seth Vargas.    Seth Vargas was a pleasant 37 year old year old male.  Per physicians order, a PSG-Overnight study was run.      Night Tech Observation Summary: Patient arrived to the clinic for a PSG-Overnight study. Patient began study in the supine position. Patient was also on his left side. Patient had noted mild to loud snoring. Patient had noted hypopneas and obstructive apneas.    Indications / History: snoring arousals fatigue and normal home sleep study     Lights off: 9:41 PM  Lights on: 5:14 AM  TRT less than 6 hours -No  Sleep Onset:  Rem Onset:  AHI: Est.Less than 15  Snoring: Mild to Loud  Split Night Time: N/A  [place] Saturation: Est.87%  ECG: SR  EEG Abnormalities/Complex nocturnal behavior: No  Unremarkable  Bruxism: No  [place]yne [place] Respiration: No  Supplemental Oxygen added/used: No  Co2 Monitor used: No  Leg Movements: None Noted    Meds taken today: Morning: None  Medications Taken tonight: None

## 2023-09-06 ENCOUNTER — Telehealth (INDEPENDENT_AMBULATORY_CARE_PROVIDER_SITE_OTHER): Payer: Commercial Managed Care - PPO | Admitting: Pulmonary Disease

## 2023-09-06 ENCOUNTER — Encounter (INDEPENDENT_AMBULATORY_CARE_PROVIDER_SITE_OTHER): Payer: Self-pay | Admitting: Pulmonary Disease

## 2023-09-06 ENCOUNTER — Telehealth (INDEPENDENT_AMBULATORY_CARE_PROVIDER_SITE_OTHER): Payer: Self-pay | Admitting: Pulmonary Disease

## 2023-09-06 VITALS — Ht 75.0 in | Wt 259.5 lb

## 2023-09-06 DIAGNOSIS — G4733 Obstructive sleep apnea (adult) (pediatric): Secondary | ICD-10-CM

## 2023-09-06 DIAGNOSIS — R5383 Other fatigue: Secondary | ICD-10-CM

## 2023-09-06 NOTE — Telephone Encounter (Signed)
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 Please review and assist.     Ph. [phone]  Or My chart message

## 2023-09-06 NOTE — Patient Instructions (Signed)
Assessment:  In lab sleep study revealed moderate obstructive sleep apnea  Discussed the nature of sleep apnea treatment with autoadjusting APAP and will arrange for an airsense 11 apap 5-15 with nasal pillow med or large   Will arrange for followup with Dr. Clide Cliff in 2-3 months or sooner if needed to assess adherence and clincal response  Patient understood and agreed.    Plan:  The patient should be cautioned against driving while feeling sleepy.  Conservative measures include the goal of seven to eight hours of sleep per night, the maintenance of nasal patency, the avoidance of depressants including alcohol and weight loss as indicated (through diet and exercise).  Adherence to therapy was discussed at some length.  General measures of sleep hygiene include the avoidance of naps, alcohol, tobacco and caffeine.  Maintaining a fixed bed time and wake up time while avoiding prolonged periods in bed while awake can be helpful.  We recommend moving the alarm clock to a position that is not visible by the patient.  We also recommend avoiding exercise close to bed time but timing exercise instead earlier in the day.  We have recommended follow-up in clinic and the patient will call sooner if necessary.

## 2023-09-06 NOTE — Progress Notes (Signed)
This patient was referred by Lorelee Cover, DO  [address]  [address]  [place],  [place] [address]      History of Present Illness:  Seth Vargas is a 37 year old male is here for followup after an in lab sleep study which showed an AHI of 21.1.  Discussed the results and advised a trial of APAP 5-15 with nasal pillow. Would like to have interface setup and instructed by the DME , possibly [facility] or [facility].  Instructed on use of equipment and will see patient in followup 2-3 months or sooner if needed. Patient understood and agreed.        The Epworth Sleepiness Score is Epworth Total Score: 7 with a maximum of 24.      ---------------------(data below generated by Lorelee Cover, DO)--------------------    Patient Verification & Telemedicine Consent:    I am proceeding with this evaluation at the direct request of the patient.  I have verified this is the correct patient and have obtained verbal consent and written consent from the patient/ surrogate to perform this voluntary telemedicine evaluation (including obtaining history, performing examination and reviewing data provided by the patient).   The patient/ surrogate has the right to refuse this evaluation.  I have explained risks (including potential loss of confidentiality), benefits, alternatives, and the potential need for subsequent face to face care. Patient/ surrogate understands that there is a risk of medical inaccuracies given that our recommendations will be made based on reported data (and we must therefore assume this information is accurate).  Knowing that there is a risk that this information is not reported accurately, and that the telemedicine video, audio, or data feed may be incomplete, the patient agrees to proceed with evaluation and holds [place] harmless knowing these risks. In this evaluation, we will be providing recommendations only. The patient/ surrogate has been notified that other healthcare  professionals (including students, residents and Engineer, maintenance) may be involved in this audio-video evaluation.   All laws concerning confidentiality and patient access to medical records and copies of medical records apply to telemedicine.  The patient/ surrogate has received the [place] Notice of Privacy Practices.  I have reviewed this above verification and consent paragraph with the patient/ surrogate.  If the patient is not capacitated to understand the above, and no surrogate is available, since this is not an emergency evaluation, the visit will be rescheduled until such time that the patient can consent, or the surrogate is available to consent.    Demographics:   Medical Record #: [id number]   Date: September 06, 2023   Patient Name: Seth Vargas   DOB: 1986-04-14  Age: 37 year old  Sex: male  Location: Home address on file    Evaluator(s):   Seth Vargas was evaluated by me today.    Clinic Location: [facility] [address] [facility]  [address]  [place] [address]        Review of Systems:    (Positives in bold)    Constitutional: fever, chills, hot flashes/night sweats in the last year, fatigue, weight change  HEENT:Has corrective lense. No vision changes, dizziness, HA, dysphagia, hearing loss, rhinorrhea, sore throat  CV: NO CP, palpitations, orthopnea, DOE, PND, peripheral edema  Respiratory: No SOB, cough, wheezing, hemoptysis  GI: No Anorexia, nausea, vomiting, diarrhea, constipation, abdominal pain, bloody stool  GU: No dysuria, hesitancy, urgency, hematuria  MSK: NO muscle weakness, joint pain, decreased range of motion  Neuro:Low back pain . NO   dizziness, weakness, numbness, tingling, syncope, bladder/bowel incontinence   Integumentary: No rash  Psych: No depression, anxiety    Past Medical History:   Diagnosis Date    Obesity        There is no problem list on file for this patient.      Past Surgical History:   Procedure  Laterality Date    HERNIA REPAIR Right 2018    inguinal hernia       Family History   Problem Relation Name Age of Onset    Cancer Mother          cervical [place]    No Known Problems Father      No Known Problems Sister      No Known Problems Brother      Cancer M Grandmother          throat [place] - smoker    No Known Problems P Grandmother      No Known Problems P Grandfather         Social History     Socioeconomic History    Marital status: Single   Tobacco Use    Smoking status: Former     Current packs/day: 0.00     Average packs/day: 0.5 packs/day for 23.0 years (11.5 ttl pk-yrs)     Types: Cigarettes     Start date: 72     Quit date: 2017     Years since quitting: 7.6    Smokeless tobacco: Never   Vaping Use    Vaping status: Never Used   Substance and Sexual Activity    Alcohol use: Yes     Alcohol/week: 1.0 standard drink of alcohol     Types: 1 Cans of beer per week    Drug use: Never    Sexual activity: Yes     Partners: Female   Other Topics Concern    Military Service No    Blood Transfusions No    Caffeine Concern No    Occupational Exposure No    Hobby Hazards No    Sleep Concern No    Stress Concern Yes     Comment: stress in owning trucking business    Weight Concern Yes    Special Diet No    Back Care Yes    Exercises Regularly Yes     Comment: twice weekly. treadmill and weights for 20 min each    Seat Belt Use Yes    Performs Self-Exams Yes         No Known Allergies    No current outpatient medications on file prior to visit.     No current facility-administered medications on file prior to visit.           I have reviewed the following  laboratory data and other diagnostic studies:   CBC:  Lab Results   Component Value Date    WBC 4.6 09/28/2022    HGB 15.4 09/28/2022    HCT 47.7 09/28/2022    PLT 161 09/28/2022     CHEM:  Lab Results   Component Value Date    NA 139 09/28/2022    K 4.3 09/28/2022    CL 103 09/28/2022    BICARB 27 09/28/2022    BUN 8 09/28/2022    CREAT 1.02 09/28/2022    GLU 85  09/28/2022    [place] 9.4 09/28/2022  COAG:  No results found for: "PT", "PTT", "INR"  LFTs:  Lab Results   Component Value Date    AST 23 09/28/2022    ALT 30 09/28/2022    ALK 57 09/28/2022    TP 7.5 09/28/2022    ALB 4.4 09/28/2022    TBILI 0.52 09/28/2022            I have nothing else to add to the documentation regarding the patient's allergies, medications, family history, social history, review of systems or physical examination.     Assessment:  In lab sleep study revealed moderate obstructive sleep apnea  Discussed the nature of sleep apnea treatment with autoadjusting APAP and will arrange for an airsense 11 apap 5-15 with nasal pillow med or large   Will arrange for followup with Dr. Clide Cliff in 2-3 months or sooner if needed to assess adherence and clincal response  Patient understood and agreed.    Plan:  The patient should be cautioned against driving while feeling sleepy.  Conservative measures include the goal of seven to eight hours of sleep per night, the maintenance of nasal patency, the avoidance of depressants including alcohol and weight loss as indicated (through diet and exercise).  Adherence to therapy was discussed at some length.  General measures of sleep hygiene include the avoidance of naps, alcohol, tobacco and caffeine.  Maintaining a fixed bed time and wake up time while avoiding prolonged periods in bed while awake can be helpful.  We recommend moving the alarm clock to a position that is not visible by the patient.  We also recommend avoiding exercise close to bed time but timing exercise instead earlier in the day.  We have recommended follow-up in clinic and the patient will call sooner if necessary.

## 2023-09-08 NOTE — Telephone Encounter (Signed)
Per Dr.kline       He needs to be adequately treated for sleep apnea and with that he can work different shifts. He needs to be cautious about feeling exessively sleepy on any shift but he should take normal precautions about doing activities when excessively sleepy such as driving or using dangerous equipment.  Dr. Clide Cliff       LEFT A VM

## 2023-09-10 ENCOUNTER — Telehealth (INDEPENDENT_AMBULATORY_CARE_PROVIDER_SITE_OTHER): Payer: Self-pay | Admitting: Pulmonary Disease

## 2023-09-10 ENCOUNTER — Encounter (INDEPENDENT_AMBULATORY_CARE_PROVIDER_SITE_OTHER): Payer: Self-pay | Admitting: Hospital

## 2023-09-10 DIAGNOSIS — G4733 Obstructive sleep apnea (adult) (pediatric): Secondary | ICD-10-CM

## 2023-09-10 NOTE — Telephone Encounter (Signed)
At 1229    I called the patient (verfied 2 forms of ID) to let them know what Dr. Clide Cliff said re: patient's questions. I read back Dr. Clide Cliff response verbatim. Patient had more questions.

## 2023-09-10 NOTE — Telephone Encounter (Signed)
Pt is calling back in regarding his question. See TE on 09/06/23.    Pt stated that he did not get any VM from the clinic.     Pt is requesting for the clinic to reach out to him again regarding his question about work restrictions.     Please assist and thank you. Please also send the [employer].     [phone] vm)- mychart message

## 2023-09-14 NOTE — Telephone Encounter (Signed)
At 1047    I called the patient to offer an appointment with Dr. Clide Cliff for Monday 09-20-23 at 1230.     I left a voice message requesting a call back.n

## 2023-09-14 NOTE — Telephone Encounter (Signed)
Patient is returning missed call. Pt states he got his answers from last week and does not need an apt. Pt states he will wait for information about DME company for his machine.      NW#[phone]  Documenting FYI

## 2024-01-13 ENCOUNTER — Encounter (INDEPENDENT_AMBULATORY_CARE_PROVIDER_SITE_OTHER): Payer: Self-pay | Admitting: Pulmonary Disease

## 2024-01-18 ENCOUNTER — Telehealth (INDEPENDENT_AMBULATORY_CARE_PROVIDER_SITE_OTHER): Payer: Self-pay | Admitting: Pulmonary Disease

## 2024-01-18 NOTE — Telephone Encounter (Signed)
Message sent to [facility] requesting update. Pending response

## 2024-01-18 NOTE — Telephone Encounter (Signed)
Hi,   We are reminding you that you have an appointment on 01/19/2024 with Sleep/Pulmonary medicine. If you are not able to make it to your appointment please call us at [phone] to reschedule. Please also check your mychart message for more details      The Pulmonology Team

## 2024-01-19 ENCOUNTER — Telehealth (INDEPENDENT_AMBULATORY_CARE_PROVIDER_SITE_OTHER): Payer: Self-pay | Admitting: Pulmonary Disease

## 2024-01-19 ENCOUNTER — Encounter (INDEPENDENT_AMBULATORY_CARE_PROVIDER_SITE_OTHER): Payer: Commercial Managed Care - PPO | Admitting: Pulmonary Disease

## 2024-01-19 NOTE — Telephone Encounter (Signed)
Called pt NA , Lm on voicemail in regards to today's visit with Dr.Kline for cpap follow up , pt has not received cpap Lm to return call to reschedule todays appointment

## 2024-03-10 ENCOUNTER — Encounter (INDEPENDENT_AMBULATORY_CARE_PROVIDER_SITE_OTHER): Payer: Self-pay

## 2024-03-27 ENCOUNTER — Other Ambulatory Visit (INDEPENDENT_AMBULATORY_CARE_PROVIDER_SITE_OTHER): Admitting: Student in an Organized Health Care Education/Training Program

## 2024-03-27 ENCOUNTER — Ambulatory Visit (INDEPENDENT_AMBULATORY_CARE_PROVIDER_SITE_OTHER): Payer: Commercial Managed Care - PPO | Admitting: Family Practice

## 2024-03-27 ENCOUNTER — Encounter (INDEPENDENT_AMBULATORY_CARE_PROVIDER_SITE_OTHER): Payer: Self-pay | Admitting: Family Practice

## 2024-03-27 ENCOUNTER — Other Ambulatory Visit (INDEPENDENT_AMBULATORY_CARE_PROVIDER_SITE_OTHER): Attending: Family Practice

## 2024-03-27 VITALS — BP 108/72 | HR 63 | Temp 97.6°F | Ht 75.0 in | Wt 269.2 lb

## 2024-03-27 DIAGNOSIS — Z131 Encounter for screening for diabetes mellitus: Secondary | ICD-10-CM | POA: Insufficient documentation

## 2024-03-27 DIAGNOSIS — Z13220 Encounter for screening for lipoid disorders: Secondary | ICD-10-CM | POA: Insufficient documentation

## 2024-03-27 DIAGNOSIS — G4733 Obstructive sleep apnea (adult) (pediatric): Secondary | ICD-10-CM

## 2024-03-27 DIAGNOSIS — Z125 Encounter for screening for malignant neoplasm of prostate: Secondary | ICD-10-CM

## 2024-03-27 DIAGNOSIS — Z1329 Encounter for screening for other suspected endocrine disorder: Secondary | ICD-10-CM

## 2024-03-27 DIAGNOSIS — Z87891 Personal history of nicotine dependence: Secondary | ICD-10-CM

## 2024-03-27 DIAGNOSIS — R21 Rash and other nonspecific skin eruption: Secondary | ICD-10-CM

## 2024-03-27 DIAGNOSIS — Z0000 Encounter for general adult medical examination without abnormal findings: Secondary | ICD-10-CM | POA: Insufficient documentation

## 2024-03-27 DIAGNOSIS — L309 Dermatitis, unspecified: Secondary | ICD-10-CM

## 2024-03-27 LAB — LIPID(CHOL FRACT) PANEL, BLOOD
Cholesterol: 165 mg/dL (ref <200)
HDL-Cholesterol: 46 mg/dL
LDL-Chol (Calc): 111 mg/dL (ref <160)
Non-HDL Cholesterol: 119 mg/dL
Triglycerides: 37 mg/dL (ref 10–170)

## 2024-03-27 LAB — TSH, BLOOD: TSH: 1.24 u[IU]/mL (ref 0.27–4.20)

## 2024-03-27 LAB — URINALYSIS WITH CULTURE REFLEX, WHEN INDICATED
Bilirubin: NEGATIVE
Blood: NEGATIVE
Glucose: NEGATIVE
Ketones: NEGATIVE
Leuk Esterase: NEGATIVE Leu/uL
Nitrite: NEGATIVE
Specific Gravity: 1.029 (ref 1.002–1.030)
Urobilinogen: NEGATIVE
pH: 7 (ref 5.0–8.0)

## 2024-03-27 LAB — CBC WITH DIFF, BLOOD
ANC-Automated: 2 10*3/uL (ref 1.6–7.0)
Abs Basophils: 0 10*3/uL (ref <0.2)
Abs Eosinophils: 0.3 10*3/uL (ref 0.0–0.5)
Abs Lymphs: 1.4 10*3/uL (ref 0.8–3.1)
Abs Monos: 0.4 10*3/uL (ref 0.2–0.8)
Basophils: 0.5 %
Eosinophils: 6.9 %
Hct: 45.8 % (ref 40.0–50.0)
Hgb: 15.2 g/dL (ref 13.7–17.5)
Imm Gran %: 0.2 % (ref <1)
Imm Gran Abs: 0 10*3/uL (ref <0.1)
Lymphocytes: 33.7 %
MCH: 27.8 pg (ref 26.0–32.0)
MCHC: 33.2 g/dL (ref 32.0–36.0)
MCV: 83.7 um3 (ref 79.0–95.0)
MPV: 11 fL (ref 9.4–12.4)
Monocytes: 10.1 %
Plt Count: 162 10*3/uL (ref 140–370)
RBC: 5.47 10*6/uL (ref 4.60–6.10)
RDW: 14 % (ref 12.0–14.0)
Segs: 48.6 %
WBC: 4 10*3/uL (ref 4.0–10.0)

## 2024-03-27 LAB — COMPREHENSIVE METABOLIC PANEL, BLOOD
ALT (SGPT): 21 U/L (ref 0–41)
AST (SGOT): 21 U/L (ref 0–40)
Albumin: 4.6 g/dL (ref 3.5–5.2)
Alkaline Phos: 55 U/L (ref 40–129)
Anion Gap: 10 mmol/L (ref 7–15)
BUN: 10 mg/dL (ref 6–20)
Bicarbonate: 28 mmol/L (ref 22–29)
Bilirubin, Tot: 0.57 mg/dL (ref <1.2)
Calcium: 9 mg/dL (ref 8.5–10.6)
Chloride: 103 mmol/L (ref 98–107)
Creatinine: 0.97 mg/dL (ref 0.67–1.17)
Glucose: 98 mg/dL (ref 70–99)
Potassium: 4.3 mmol/L (ref 3.5–5.1)
Sodium: 141 mmol/L (ref 136–145)
Total Protein: 7.3 g/dL (ref 6.0–8.0)
eGFR Based on CKD-EPI 2021 Equation: >60 mL/min/{1.73_m2}

## 2024-03-27 LAB — GLYCOSYLATED HGB(A1C), BLOOD: Glyco Hgb (A1C): 5.2 % (ref 4.8–5.8)

## 2024-03-27 LAB — PSA (SCREEN), BLOOD: PSA: 0.72 ng/mL (ref 0.00–3.99)

## 2024-03-27 MED ORDER — HYDROXYZINE HCL 25 MG OR TABS
ORAL_TABLET | ORAL | 0 refills | Status: AC
Start: 2024-03-27 — End: ?

## 2024-03-27 NOTE — Progress Notes (Signed)
 Mychart msg sent

## 2024-03-27 NOTE — Interdisciplinary (Signed)
Blood drawn from left arm with 23 gauge needle. 3 tubes taken.   Patient identity authenticated by  .

## 2024-03-27 NOTE — Result Encounter Note (Signed)
 My Chart message sent

## 2024-03-27 NOTE — Progress Notes (Signed)
 Differential diagnosis includes a heavily lichenified atopic dermatitis or even crusted ([place] scabies) or an unlikely mycosis fungoides. I agree that an in person dermatology visit is important to develop a personalized diagnostic work up and treatment plan.     Recommendations:  - low threshold to consider empiric therapy for scabies, especially if patient has itchy contacts or other risk factors  - mineral oil scrape if possible  - I recommend against systemic steroids  - can start wet wraps with triamcinolone , see the national eczema association for specific recommendations  - screen for atopic or other allergic disorders         I spent a total of 6 minutes in review and consultative electronic discussion. Patient has not been seen within the last 14 days. At this time, a face-to-face visit is not recommended. The recommendations provided in this eConsult are based on the clinical data available to me at the time, and are furnished without the benefit of a comprehensive in-person evaluation of the patient.  Any new clinical issues or changes in patient status since the filing of this eConsult will need to be taken into account when assessing these recommendations.     Robynn Little, MD, PhD  [facility]

## 2024-03-27 NOTE — Progress Notes (Signed)
 PATIENT:  Seth Vargas  MRN:  [id number]  DOB:  September 03, 1986  PRIMARY CARE PROVIDER: Nola Drones  TODAY'S PROVIDER: Drones Nola, MD    History of Present Illness  The patient is a 38 year old male who presents to establish care and follow up on a rash.  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 He rates his current condition as a 7 on a scale of 1 to 10, with 10 being the most severe. He reports no new medications or changes in detergent use, although he has been traveling frequently for work. He has always used Gain liquid detergent. He has not undergone any skin biopsies. He reports no rashes on his legs, buttocks, or genitalia. He experiences itching predominantly at night. He was informed by urgent care that his rash could be fungal. He reports no blisters but notes that hot water exacerbates his symptoms.  He has experienced swelling in his hands when rash exacerbated, which occasionally prevents him from wearing his wedding ring.    He has been using cocoa butter lotion for temporary relief. He has not been taking antihistamines.     Pt sought medical attention at an urgent care facility approximately 6 weeks ago, where he was prescribed prednisone and a steroid crm. These treatments initially alleviated his symptoms, but they have since recurred. He has not consulted a dermatologist for this new rash, but he did see a dermatologist years ago in [place], dx with hand eczema.    He has not taken any new medications    He recalls a period of frequent travel for work, but he is now home. Will be working night shifts temporarily  Pt does frequently wash his hands. He wears gloves at work when necessary and uses latex-free, powder-free gloves. He reports no family history of psoriasis.    He has sleep apnea and has recently started using  a CPAP machine. He reports significant snoring and feels more energetic today. Pt is under the care of Dr. Fernande.    He wears glasses and has difficulty seeing things from far away. His last eye exam was in January 2025, during which he was checked for glaucoma.    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 His bowel movements are regular. He had Rt inguinal hernia surgery approximately 4 years ago and has not experienced any complications since. He reports no pain. He had issues with lifting heavy objects while working at [employer]. He does not participate in sports but engages in regular exercise. He had elevated cholesterol levels in the past, which he managed through diet and exercise, and has not had any recent issues.    SOCIAL HISTORY  He stopped smoking in 2017.    FAMILY HISTORY  His maternal aunt had skin cancer, his mother had cervical cancer, and his grandmother had throat cancer. No family history of diabetes, stroke, or high blood pressure.    ALLERGIES  The patient is allergic to SHELLFISH, which causes throat swelling, and POLLEN, which causes watery, red, itchy eyes, and a runny nose.    MEDICATIONS  Past: Prednisone        Past Medical History:   Diagnosis Date    Former smoker  Obesity     OSA (obstructive sleep apnea)        Labs and chart reviewed.    Patient Active Problem List   Diagnosis    OSA (obstructive sleep apnea)    Former smoker       Social History:  none     Socioeconomic History    Marital status: Single   Tobacco Use    Smoking status: Former     Current packs/day: 0.00     Average packs/day: 0.5 packs/day for 23.0 years (11.5 ttl pk-yrs)     Types: Cigarettes     Start date: 71     Quit date: 2017     Years since quitting: 8.2    Smokeless tobacco: Never   Vaping Use    Vaping status: Never Used   Substance and Sexual Activity    Alcohol use: Yes     Alcohol/week: 1.0 standard drink of alcohol      Types: 1 Cans of beer per week    Drug use: Never    Sexual activity: Yes     Partners: Female   Other Topics Concern    Military Service No    Blood Transfusions No    Caffeine Concern No    Occupational Exposure No    Hobby Hazards No    Sleep Concern No    Stress Concern Yes     Comment: stress in owning trucking business    Weight Concern Yes    Special Diet No    Back Care Yes    Exercises Regularly Yes     Comment: twice weekly. treadmill and weights for 20 min each    Seat Belt Use Yes    Performs Self-Exams Yes     Social Determinants of Health     Food Insecurity: No Food Insecurity (03/26/2024)    Hunger Vital Sign     Worried About Running Out of Food in the Last Year: Never true     Ran Out of Food in the Last Year: Never true   Transportation Needs: No Transportation Needs (03/26/2024)    PRAPARE - Therapist, Art (Medical): No     Lack of Transportation (Non-Medical): No   Housing Stability: Unknown (03/26/2024)    Housing Stability Vital Sign     Unable to Pay for Housing in the Last Year: No     Homeless in the Last Year: No       Allergies:  No Known Allergies        Past Surgical History:   Procedure Laterality Date    HERNIA REPAIR Right 2018    inguinal hernia           Family History   Problem Relation Name Age of Onset    Cancer Mother          cervical ca    No Known Problems Father      No Known Problems Sister      No Known Problems Brother      Cancer M Grandmother          throat [place] - smoker    No Known Problems P Grandmother      No Known Problems P Grandfather             Review of Systems   Constitutional:  Negative for chills and fever.   Respiratory:  Positive for apnea (on CPAP).    Cardiovascular:  Negative  for chest pain, palpitations and leg swelling.           Objective:  BP 108/72 (BP Location: Left arm, BP Patient Position: Sitting, BP cuff size: Regular)   Pulse 63   Temp 97.6 F (36.4 C) (Temporal Artery)   Ht 6' 3 (1.905 m)   Wt 122.1 kg (269 lb  3.2 oz)   SpO2 98%   BMI 33.65 kg/m  Body mass index is 33.65 kg/m.      Physical Exam  Constitutional:       Appearance: Normal appearance.   HENT:      Head: Normocephalic and atraumatic.      Right Ear: Tympanic membrane, ear canal and external ear normal.      Left Ear: Tympanic membrane, ear canal and external ear normal.      Nose: Nose normal. No congestion or rhinorrhea.      Mouth/Throat:      Mouth: Mucous membranes are moist.      Pharynx: Oropharynx is clear. No oropharyngeal exudate.   Eyes:      Extraocular Movements: Extraocular movements intact.      Conjunctiva/sclera: Conjunctivae normal.      Pupils: Pupils are equal, round, and reactive to light.   Neck:      Vascular: No carotid bruit.   Cardiovascular:      Rate and Rhythm: Normal rate and regular rhythm.      Pulses: Normal pulses.   Pulmonary:      Effort: Pulmonary effort is normal. No respiratory distress.      Breath sounds: Normal breath sounds. No stridor. No wheezing, rhonchi or rales.   Chest:      Chest wall: No tenderness.   Abdominal:      General: Abdomen is flat. There is no distension.      Tenderness: There is no abdominal tenderness.   Musculoskeletal:         General: No swelling or tenderness.      Cervical back: Neck supple.      Right lower leg: No edema.      Left lower leg: No edema.   Lymphadenopathy:      Cervical: No cervical adenopathy.   Skin:     General: Skin is warm.      Coloration: Skin is not jaundiced.      Findings: Rash present.      Comments: See media pictures  Lt hand: hyperkeratotic reddish coalescent rash left hand with excoriations/small fissures. Lt arm/chest/Rt arm: light brown pigmented rash without scaling. No blisters   Neurological:      General: No focal deficit present.      Mental Status: He is alert and oriented to person, place, and time.      Cranial Nerves: No cranial nerve deficit.      Gait: Gait normal.   Psychiatric:         Mood and Affect: Mood normal.         Behavior: Behavior  normal.             Lab:   Lab Results   Component Value Date    WBC 4.6 09/28/2022    RBC 5.74 09/28/2022    HGB 15.4 09/28/2022    HCT 47.7 09/28/2022    MCV 83.1 09/28/2022    MCHC 32.3 09/28/2022    RDW 14.4 (H) 09/28/2022    PLT 161 09/28/2022    MPV 11.2 09/28/2022    SEG 56 09/28/2022  LYMPHS 31 09/28/2022    MONOS 8 09/28/2022    EOS 5 09/28/2022    BASOS 1 09/28/2022     Lab Results   Component Value Date    CHOL 178 09/28/2022    HDL 50 09/28/2022    LDLCALC 119 09/28/2022    TRIG 43 09/28/2022    TSH 1.25 09/28/2022    A1C 5.1 09/28/2022     Lab Results   Component Value Date    BUN 8 09/28/2022    CREAT 1.02 09/28/2022    CL 103 09/28/2022    NA 139 09/28/2022    K 4.3 09/28/2022    CA 9.4 09/28/2022    TBILI 0.52 09/28/2022    ALB 4.4 09/28/2022    TP 7.5 09/28/2022    AST 23 09/28/2022    ALK 57 09/28/2022    BICARB 27 09/28/2022    ALT 30 09/28/2022    GLU 85 09/28/2022     Lab Results   Component Value Date    TSH 1.25 09/28/2022     Lab Results   Component Value Date    A1C 5.1 09/28/2022     No results found for: COLORUA, APPEARUA, GLUCOSEUA, BILIUA, KETONEUA, SGUA, BLOODUA, PHUA, PROTEINUA, UROBILUA, NITRITEUA, LEUKESTUA, WBCUA, RBCUA, EPITHCELLSUA, HYALINEUA, CRYSTALSUA, COMMENTSUA    Imaging:     No results found.      Assessment & Plan    Health maintenance examination done today     Advised to continue annual eye examinations and regular dental follow-up  Eye exam UTD 1/25 - cont glasses wear. IOP WNL      Vaccinations up-to-date    Age appropriate anticipatory guidance given on nutrition, activity and general health.  Counseled on heart healthy diet and exercise  Annual labs ordered - will do today      I have performed a comprehensive assessment of risk factors appropriate to pt's age, reviewed and reconciled all current medications and supplements.  Counseled on anticipatory guidance and risk factor reduction.    Upcoming health maintenance schedule  reviewed face to face and a written copy was provided on today's after visit summary.        1. Rash.  The rash appears reddish base/silvery scaly, suggesting a possible diagnosis of psoriasis, although this remains uncertain.   Plan: He has been advised to transition to unscented detergent and to moisturize his skin as much as possible. Frequent hand washing followed by lotion application has been recommended. The use of hypoallergenic gloves at work has been suggested, ensuring they are not worn for extended periods to prevent moisture trapping. He has been cautioned against scratching the rash.     An e-consultation with a dermatologist will be initiated for further evaluation of the rash. Hydroxyzine  25 mg Rx given to be taken at night for itching, with the caveat that it may induce drowsiness and should therefore not be taken during the day. Benadryl gel has been recommended for application every 8 hours as needed for itching.     In the event of severe symptoms, prednisone may be reintroduced.  Await econsult for derm recommendations  Refer to derm for a formal consult re rash/possible bx -  referral placed      2. Sleep apnea.  He has been advised to continue using the nasal CPAP machine. If the nasal CPAP proves ineffective due to mouth breathing, a full mask may be considered.  F/u Dr. Kline  Medication Review:  Medications reviewed with patient and medication list reconciled.  Over the counter medications, herbal therapies and supplements reviewed.  Patient's understanding and response to medications assessed.   Barriers to medications assessed and addressed.   Risks, benefits, alternatives to medications reviewed.    Patient Instruction: See Patient Education Section    No barriers to learning, verbalizes understanding of teaching and instructions.    I personally spent total 30 minutes in face-to-face and non-face-to-face activities  related to the patient's problem visit today, excluding any  separately reportable annual physical/preventative exam services      Wilbert Danes, MD  [facility]  [address], [place]

## 2024-03-29 ENCOUNTER — Encounter (INDEPENDENT_AMBULATORY_CARE_PROVIDER_SITE_OTHER): Payer: Self-pay | Admitting: Family Practice

## 2024-07-31 ENCOUNTER — Telehealth (INDEPENDENT_AMBULATORY_CARE_PROVIDER_SITE_OTHER): Payer: Self-pay | Admitting: Student in an Organized Health Care Education/Training Program

## 2024-07-31 NOTE — Telephone Encounter (Signed)
 Called pt to offer sooner appointment no answer left vm.

## 2024-08-01 ENCOUNTER — Encounter (INDEPENDENT_AMBULATORY_CARE_PROVIDER_SITE_OTHER): Payer: Self-pay | Admitting: Pulmonary Disease

## 2024-08-03 ENCOUNTER — Telehealth (HOSPITAL_BASED_OUTPATIENT_CLINIC_OR_DEPARTMENT_OTHER): Payer: Self-pay | Admitting: Student in an Organized Health Care Education/Training Program

## 2024-08-03 ENCOUNTER — Encounter (INDEPENDENT_AMBULATORY_CARE_PROVIDER_SITE_OTHER): Payer: Self-pay | Admitting: Pulmonary Disease

## 2024-08-03 DIAGNOSIS — G4733 Obstructive sleep apnea (adult) (pediatric): Secondary | ICD-10-CM

## 2024-08-03 NOTE — Telephone Encounter (Signed)
 LVM requesting pt call back to verify or provide new insurance info prior to upcoming appt on 08/11/24.

## 2024-08-11 ENCOUNTER — Ambulatory Visit (INDEPENDENT_AMBULATORY_CARE_PROVIDER_SITE_OTHER): Admitting: Student in an Organized Health Care Education/Training Program

## 2024-09-12 ENCOUNTER — Encounter (INDEPENDENT_AMBULATORY_CARE_PROVIDER_SITE_OTHER): Payer: Self-pay | Admitting: Pulmonary Disease

## 2024-09-12 DIAGNOSIS — G4733 Obstructive sleep apnea (adult) (pediatric): Secondary | ICD-10-CM

## 2024-09-19 ENCOUNTER — Encounter (INDEPENDENT_AMBULATORY_CARE_PROVIDER_SITE_OTHER): Payer: Self-pay | Admitting: Pulmonary Disease

## 2024-10-16 ENCOUNTER — Encounter (INDEPENDENT_AMBULATORY_CARE_PROVIDER_SITE_OTHER): Payer: Self-pay | Admitting: Pulmonary Disease

## 2024-10-24 ENCOUNTER — Encounter (INDEPENDENT_AMBULATORY_CARE_PROVIDER_SITE_OTHER): Payer: Self-pay | Admitting: Pulmonary Disease

## 2024-11-09 NOTE — Progress Notes (Signed)
 Order has been completed by Dr. Beverli.

## 2024-11-09 NOTE — Addendum Note (Signed)
 Addended by: Nathalie Cavendish on: 11/09/2024 08:24 AM     Modules accepted: Orders
